# Patient Record
Sex: Female | Born: 1935 | Race: White | Hispanic: No | Marital: Single | Smoking: Never smoker
Health system: Southern US, Community
[De-identification: ages and names within clinical notes are randomized; demographics above are authoritative.]

## PROBLEM LIST (undated history)

## (undated) DIAGNOSIS — E7800 Pure hypercholesterolemia, unspecified: Secondary | ICD-10-CM

## (undated) DIAGNOSIS — E079 Disorder of thyroid, unspecified: Secondary | ICD-10-CM

## (undated) DIAGNOSIS — F0390 Unspecified dementia without behavioral disturbance: Secondary | ICD-10-CM

## (undated) HISTORY — PX: TONSILLECTOMY: SUR1361

## (undated) HISTORY — PX: GASTRIC BANDING PORT REVISION: SHX5246

---

## 2015-04-21 ENCOUNTER — Emergency Department (HOSPITAL_COMMUNITY): Payer: Medicare (Managed Care)

## 2015-04-21 ENCOUNTER — Encounter (HOSPITAL_COMMUNITY): Payer: Self-pay | Admitting: *Deleted

## 2015-04-21 ENCOUNTER — Observation Stay (HOSPITAL_COMMUNITY)
Admission: EM | Admit: 2015-04-21 | Discharge: 2015-04-23 | Disposition: A | Discharge status: Home / Self Care | Payer: Medicare (Managed Care) | Attending: Family Medicine | Admitting: Family Medicine

## 2015-04-21 DIAGNOSIS — R627 Adult failure to thrive: Secondary | ICD-10-CM

## 2015-04-21 DIAGNOSIS — E860 Dehydration: Secondary | ICD-10-CM | POA: Diagnosis present

## 2015-04-21 DIAGNOSIS — Z7982 Long term (current) use of aspirin: Secondary | ICD-10-CM | POA: Insufficient documentation

## 2015-04-21 DIAGNOSIS — R110 Nausea: Secondary | ICD-10-CM | POA: Diagnosis not present

## 2015-04-21 DIAGNOSIS — N390 Urinary tract infection, site not specified: Secondary | ICD-10-CM | POA: Insufficient documentation

## 2015-04-21 DIAGNOSIS — Z9884 Bariatric surgery status: Secondary | ICD-10-CM | POA: Diagnosis not present

## 2015-04-21 DIAGNOSIS — E876 Hypokalemia: Secondary | ICD-10-CM | POA: Insufficient documentation

## 2015-04-21 DIAGNOSIS — Z23 Encounter for immunization: Secondary | ICD-10-CM | POA: Diagnosis not present

## 2015-04-21 DIAGNOSIS — Z79899 Other long term (current) drug therapy: Secondary | ICD-10-CM | POA: Diagnosis not present

## 2015-04-21 DIAGNOSIS — R059 Cough, unspecified: Secondary | ICD-10-CM

## 2015-04-21 DIAGNOSIS — I951 Orthostatic hypotension: Secondary | ICD-10-CM | POA: Diagnosis not present

## 2015-04-21 DIAGNOSIS — R05 Cough: Secondary | ICD-10-CM

## 2015-04-21 DIAGNOSIS — F0390 Unspecified dementia without behavioral disturbance: Secondary | ICD-10-CM | POA: Insufficient documentation

## 2015-04-21 DIAGNOSIS — E7800 Pure hypercholesterolemia, unspecified: Secondary | ICD-10-CM | POA: Diagnosis not present

## 2015-04-21 DIAGNOSIS — R822 Biliuria: Secondary | ICD-10-CM | POA: Diagnosis not present

## 2015-04-21 HISTORY — DX: Unspecified dementia, unspecified severity, without behavioral disturbance, psychotic disturbance, mood disturbance, and anxiety: F03.90

## 2015-04-21 HISTORY — DX: Disorder of thyroid, unspecified: E07.9

## 2015-04-21 HISTORY — DX: Pure hypercholesterolemia, unspecified: E78.00

## 2015-04-21 LAB — URINALYSIS, ROUTINE W REFLEX MICROSCOPIC
GLUCOSE, UA: NEGATIVE mg/dL
KETONES UR: 40 mg/dL — AB
NITRITE: NEGATIVE
PH: 6 (ref 5.0–8.0)
PROTEIN: 100 mg/dL — AB
SPECIFIC GRAVITY, URINE: 1.025 (ref 1.005–1.030)
Urobilinogen, UA: 0.2 mg/dL (ref 0.0–1.0)

## 2015-04-21 LAB — COMPREHENSIVE METABOLIC PANEL
ALBUMIN: 3.7 g/dL (ref 3.5–5.0)
ALT: 13 U/L — ABNORMAL LOW (ref 14–54)
ANION GAP: 15 (ref 5–15)
AST: 23 U/L (ref 15–41)
Alkaline Phosphatase: 67 U/L (ref 38–126)
BILIRUBIN TOTAL: 1.9 mg/dL — AB (ref 0.3–1.2)
BUN: 33 mg/dL — AB (ref 6–20)
CHLORIDE: 108 mmol/L (ref 101–111)
CO2: 20 mmol/L — AB (ref 22–32)
Calcium: 8.8 mg/dL — ABNORMAL LOW (ref 8.9–10.3)
Creatinine, Ser: 1.12 mg/dL — ABNORMAL HIGH (ref 0.44–1.00)
GFR calc Af Amer: 50 mL/min — ABNORMAL LOW (ref >60)
GFR calc non Af Amer: 43 mL/min — ABNORMAL LOW (ref >60)
GLUCOSE: 116 mg/dL — AB (ref 65–99)
POTASSIUM: 2.8 mmol/L — AB (ref 3.5–5.1)
SODIUM: 143 mmol/L (ref 135–145)
TOTAL PROTEIN: 6.6 g/dL (ref 6.5–8.1)

## 2015-04-21 LAB — CBC WITH DIFFERENTIAL/PLATELET
BASOS ABS: 0 10*3/uL (ref 0.0–0.1)
BASOS PCT: 0 %
EOS ABS: 0 10*3/uL (ref 0.0–0.7)
Eosinophils Relative: 0 %
HEMATOCRIT: 38.1 % (ref 36.0–46.0)
HEMOGLOBIN: 13.1 g/dL (ref 12.0–15.0)
Lymphocytes Relative: 6 %
Lymphs Abs: 0.8 10*3/uL (ref 0.7–4.0)
MCH: 32.3 pg (ref 26.0–34.0)
MCHC: 34.4 g/dL (ref 30.0–36.0)
MCV: 94.1 fL (ref 78.0–100.0)
MONO ABS: 0.8 10*3/uL (ref 0.1–1.0)
Monocytes Relative: 6 %
NEUTROS ABS: 11.7 10*3/uL — AB (ref 1.7–7.7)
NEUTROS PCT: 88 %
Platelets: 234 10*3/uL (ref 150–400)
RBC: 4.05 MIL/uL (ref 3.87–5.11)
RDW: 14.8 % (ref 11.5–15.5)
WBC: 13.3 10*3/uL — ABNORMAL HIGH (ref 4.0–10.5)

## 2015-04-21 LAB — TROPONIN I: TROPONIN I: 0.04 ng/mL — AB (ref <0.031)

## 2015-04-21 LAB — LIPASE, BLOOD: Lipase: 36 U/L (ref 22–51)

## 2015-04-21 LAB — URINE MICROSCOPIC-ADD ON

## 2015-04-21 MED ORDER — ONDANSETRON 4 MG PO TBDP
4.0000 mg | ORAL_TABLET | Freq: Three times a day (TID) | ORAL | Status: DC | PRN
  Administered 2015-04-21 – 2015-04-23 (×2): 4 mg via ORAL
  Filled 2015-04-21 (×2): qty 1

## 2015-04-21 MED ORDER — SODIUM CHLORIDE 0.9 % IV SOLN
INTRAVENOUS | Status: AC
  Administered 2015-04-21: 75 mL/h via INTRAVENOUS

## 2015-04-21 MED ORDER — SODIUM CHLORIDE 0.9 % IV SOLN
INTRAVENOUS | Status: DC
  Administered 2015-04-21 – 2015-04-23 (×4): via INTRAVENOUS

## 2015-04-21 MED ORDER — SODIUM CHLORIDE 0.9 % IV SOLN: Freq: Once | INTRAVENOUS | Status: DC

## 2015-04-21 MED ORDER — POTASSIUM CHLORIDE CRYS ER 20 MEQ PO TBCR
40.0000 meq | EXTENDED_RELEASE_TABLET | ORAL | Status: AC
  Administered 2015-04-21: 40 meq via ORAL
  Filled 2015-04-21: qty 2

## 2015-04-21 MED ORDER — SODIUM CHLORIDE 0.9 % IV BOLUS (SEPSIS)
1000.0000 mL | Freq: Once | INTRAVENOUS | Status: AC
  Administered 2015-04-21: 1000 mL via INTRAVENOUS

## 2015-04-21 MED ORDER — CEPHALEXIN 250 MG PO CAPS
250.0000 mg | ORAL_CAPSULE | Freq: Three times a day (TID) | ORAL | Status: DC
  Administered 2015-04-21 – 2015-04-23 (×6): 250 mg via ORAL
  Filled 2015-04-21 (×6): qty 1

## 2015-04-21 MED ORDER — POTASSIUM CHLORIDE CRYS ER 20 MEQ PO TBCR
40.0000 meq | EXTENDED_RELEASE_TABLET | Freq: Once | ORAL | Status: AC
  Administered 2015-04-21: 40 meq via ORAL
  Filled 2015-04-21: qty 2

## 2015-04-21 MED ORDER — ACETAMINOPHEN 325 MG PO TABS: 650.0000 mg | ORAL_TABLET | Freq: Four times a day (QID) | ORAL | Status: DC | PRN

## 2015-04-21 MED ORDER — LEVOTHYROXINE SODIUM 75 MCG PO TABS
75.0000 ug | ORAL_TABLET | Freq: Every day | ORAL | Status: DC
  Administered 2015-04-22 – 2015-04-23 (×2): 75 ug via ORAL
  Filled 2015-04-21 (×2): qty 1

## 2015-04-21 MED ORDER — ONDANSETRON HCL 4 MG/2ML IJ SOLN
4.0000 mg | Freq: Once | INTRAMUSCULAR | Status: AC
  Administered 2015-04-21: 4 mg via INTRAVENOUS
  Filled 2015-04-21: qty 2

## 2015-04-21 MED ORDER — ACETAMINOPHEN 650 MG RE SUPP: 650.0000 mg | Freq: Four times a day (QID) | RECTAL | Status: DC | PRN

## 2015-04-21 NOTE — ED Provider Notes (Signed)
CSN: [id number]     Arrival date & time 04/21/15  1058 History  By signing my name below, I, Ronney Lion, attest that this documentation has been prepared under the direction and in the presence of Glynn Octave, MD. Electronically Signed: Ronney Lion, ED Scribe. 04/21/2015. 9:54 PM   Chief Complaint  Patient presents with  . Cough   The history is provided by the patient. No language interpreter was used.    HPI Comments: Melissa Green is a 81 y.o. female who presents to the Emergency Department complaining of a constant, productive cough for 2 days. Patient also notes associated nausea with dry heaves, loss of appetite, and feeling generally unwell, lying around more than usual and with decreased PO intake. This is a new problem. Patient denies a history of smoking. She also denies any known history of asthma or COPD. She denies fever, vomiting, dysuria, sore throat, chest pain, SOB, back pain, falls, or any other pain. Patient recently moved in with family members from [place].  Past Medical History  Diagnosis Date  . Thyroid disease   . Hypercholesterolemia   . Dementia    Past Surgical History  Procedure Laterality Date  . Tonsillectomy    . Gastric banding port revision     History reviewed. No pertinent family history. Social History  Substance Use Topics  . Smoking status: Never Smoker   . Smokeless tobacco: None  . Alcohol Use: No   OB History    No data available     Review of Systems A complete 10 system review of systems was obtained and all systems are negative except as noted in the HPI and PMH.    Allergies  Review of patient's allergies indicates no known allergies.  Home Medications   Prior to Admission medications   Medication Sig Start Date End Date Taking? Authorizing Provider  alendronate (FOSAMAX) 70 MG tablet Take 70 mg by mouth once a week. Take with a full glass of water on an empty stomach.   Yes Historical Provider, MD  aspirin EC 81 MG  tablet Take 81 mg by mouth daily.   Yes Historical Provider, MD  atorvastatin (LIPITOR) 40 MG tablet Take 40 mg by mouth daily.   Yes Historical Provider, MD  levothyroxine (SYNTHROID, LEVOTHROID) 75 MCG tablet Take 75 mcg by mouth daily before breakfast.   Yes Historical Provider, MD   BP 130/104 mmHg  Pulse 71  Temp(Src) 98.3 F (36.8 C) (Oral)  Resp 19  Ht 5' 1.5" (1.562 m)  Wt 126 lb (57.153 kg)  BMI 23.42 kg/m2  SpO2 98% Physical Exam  Constitutional: She is oriented to person, place, and time. She appears well-developed and well-nourished. No distress.  HENT:  Head: Normocephalic and atraumatic.  Mouth/Throat: Oropharynx is clear and moist. No oropharyngeal exudate.  Dry mucous membranes.  Eyes: Conjunctivae and EOM are normal. Pupils are equal, round, and reactive to light.  Neck: Normal range of motion. Neck supple.  No meningismus.  Cardiovascular: Normal rate, regular rhythm, normal heart sounds and intact distal pulses.   No murmur heard. Pulmonary/Chest: Effort normal and breath sounds normal. No respiratory distress. She has no wheezes.  Lungs are clear to auscultation.   Abdominal: Soft. There is tenderness. There is no rebound and no guarding.  Diffuse abdominal tenderness.  Musculoskeletal: Normal range of motion. She exhibits no edema or tenderness.  Neurological: She is alert and oriented to person, place, and time. No cranial nerve deficit. She exhibits normal muscle tone.  Coordination normal.  No ataxia on finger to nose bilaterally. No pronator drift. 5/5 strength throughout. CN 2-12 intact. Equal grip strength. Sensation intact.   Skin: Skin is warm.  Psychiatric: She has a normal mood and affect. Her behavior is normal.  Nursing note and vitals reviewed.   ED Course  Procedures (including critical care time)  DIAGNOSTIC STUDIES: Oxygen Saturation is 100% on RA, normal by my interpretation.    COORDINATION OF CARE: 12:12 PM - Discussed treatment plan  with pt at bedside which includes bloodwork, UA, and IV fluids. Pt verbalized understanding and agreed to plan.   Labs Review Labs Reviewed  CBC WITH DIFFERENTIAL/PLATELET - Abnormal; Notable for the following:    WBC 13.3 (*)    Neutro Abs 11.7 (*)    All other components within normal limits  COMPREHENSIVE METABOLIC PANEL - Abnormal; Notable for the following:    Potassium 2.8 (*)    CO2 20 (*)    Glucose, Bld 116 (*)    BUN 33 (*)    Creatinine, Ser 1.12 (*)    Calcium 8.8 (*)    ALT 13 (*)    Total Bilirubin 1.9 (*)    GFR calc non Af Amer 43 (*)    GFR calc Af Amer 50 (*)    All other components within normal limits  URINALYSIS, ROUTINE W REFLEX MICROSCOPIC (NOT AT [facility]) - Abnormal; Notable for the following:    APPearance HAZY (*)    Hgb urine dipstick LARGE (*)    Bilirubin Urine MODERATE (*)    Ketones, ur 40 (*)    Protein, ur 100 (*)    Leukocytes, UA TRACE (*)    All other components within normal limits  TROPONIN I - Abnormal; Notable for the following:    Troponin I 0.04 (*)    All other components within normal limits  URINE MICROSCOPIC-ADD ON - Abnormal; Notable for the following:    Bacteria, UA MANY (*)    All other components within normal limits  URINE CULTURE  LIPASE, BLOOD  COMPREHENSIVE METABOLIC PANEL  CBC    Imaging Review Dg Chest 2 View  04/21/2015  CLINICAL DATA:  Cough and congestion for 2 days EXAM: CHEST  2 VIEW COMPARISON:  None. FINDINGS: Normal heart size. Aortic tortuosity, mild for age. There is no edema, consolidation, effusion, or pneumothorax. IMPRESSION: No active cardiopulmonary disease. Electronically Signed   By: Marnee SpringJonathon  Watts M.D.   On: 04/21/2015 11:57   I have personally reviewed and evaluated these images and lab results as part of my medical decision-making.   EKG Interpretation   Date/Time:  Thursday April 21 2015 12:29:27 EDT Ventricular Rate:  74 PR Interval:  138 QRS Duration: 97 QT Interval:  397 QTC  Calculation: 440 R Axis:   -27 Text Interpretation:  Sinus rhythm Borderline left axis deviation Abnormal  R-wave progression, early transition Artifact in lead(s) I II III aVR aVL  aVF V1 V2 V5 Artifact No previous ECGs available Confirmed by Manus GunningANCOUR  MD,  Laquilla Dault [phone]([id number]) on 04/21/2015 12:33:54 PM      MDM   Final diagnoses:  Dehydration   patient recently moved from [place] has been feeling unwell for the past 3 days, not eating, not drinking, cough, nausea without vomiting. No appetite and wanting to lay around in bed. No fevers. No chest pain.  Dry appearing. Vital stable. Afebrile. Lungs are clear with a negative chest x-ray.  Labs show leukocytosis, hypokalemia, minimal troponin elevation. Orthostatics positive.  IV fluids given. Urinalysis consistent with infection.  Failure to thrive, dehydration, hypokalemia, confusion likely worsen in setting of UTI.  Admission for IV fluids and antibiotics discussed with Dr. Irene Limbo.   I personally performed the services described in this documentation, which was scribed in my presence. The recorded information has been reviewed and is accurate.   Glynn Octave, MD 04/21/15 2156

## 2015-04-21 NOTE — H&P (Signed)
History and Physical  Melissa FrancoSharon Green VHQ:[id number] DOB: 12-02-1924 DOA: 04/21/2015  Referring physician: Dr. Manus Gunningancour in ED PCP: PROVIDER NOT IN SYSTEM None, just moved from [place]  Chief Complaint: feels poorly  HPI:  89yow PMH unclear, just moved from [place], was brought to the emergency department for failure to thrive and poor oral intake. She was found to have orthostatic hypotension, dehydration, hypokalemia and a possible UTI.  The patient is confused, believes [place] in [place] and is unable to provide much history but does report some nausea and poor intake. History is predominantly obtained from niece Margaretha GlassingLoretta who reports the patient's health has declined over the last year in [place], she was subsisting on Pepsi and potato chips and cheese, so they went and picked her up 3 weeks ago and brought her to live with them. She is normally very ambulatory and can take care of herself and only gets confused when she is tired but over the last several days she has stopped eating and she was noted to be dehydrated complaining of some nausea and vague abdominal pain which they thought was secondary to hunger.  Complete medical history is not known but she does have a thyroid problem for which she takes medication.  Niece hopes to take the patient home very soon and wishes for her to be comfortable  In the emergency department afebrile, VSS but orthostatic, no hypoxia Pertinent labs: potassium 2.8, BUN 33, creatinine 1.12, t bili 1.9, normal AP and lipase. Urinalysis positive.  EKG: Independently reviewed. SR, poor quality. No acute changes. Imaging: independently reviewed. CXR NAD  Review of Systems:  Negative for fever, visual changes, sore throat, rash, new muscle aches, chest pain, SOB, dysuria, bleeding, v/abdominal pain.  Positive for nausea  Past Medical History  Diagnosis Date  . Thyroid disease   . Hypercholesterolemia   . Dementia     Past Surgical History  Procedure  Laterality Date  . Tonsillectomy    . Gastric banding port revision      Social History:  reports that she has never smoked. She does not have any smokeless tobacco history on file. She reports that she does not drink alcohol. Her drug history is not on file.    No Known Allergies  History reviewed. No pertinent family history. Denies particular medical problems in family  Prior to Admission medications   Medication Sig Start Date End Date Taking? Authorizing Provider  alendronate (FOSAMAX) 70 MG tablet Take 70 mg by mouth once a week. Take with a full glass of water on an empty stomach.   Yes Historical Provider, MD  aspirin EC 81 MG tablet Take 81 mg by mouth daily.   Yes Historical Provider, MD  atorvastatin (LIPITOR) 40 MG tablet Take 40 mg by mouth daily.   Yes Historical Provider, MD  levothyroxine (SYNTHROID, LEVOTHROID) 75 MCG tablet Take 75 mcg by mouth daily before breakfast.   Yes Historical Provider, MD   Physical Exam: Filed Vitals:   04/21/15 1500 04/21/15 1530 04/21/15 1533 04/21/15 1553  BP: 164/61 149/77  170/78  Pulse: 65 69  78  Temp:   97.8 F (36.6 C) 98.3 F (36.8 C)  TempSrc:   Oral   Resp: 21 19  20   Height:    5' 1.5" (1.562 m)  Weight:    57.153 kg (126 lb)  SpO2: 97% 98%  98%     General:  Appears calm and comfortable, wears glasses Eyes: PERRL, normal lids, irises   ENT: grossly  normal hearing, lips   Neck: no LAD, masses or thyromegaly Cardiovascular: RRR, no m/r/g. No LE edema. Respiratory: CTA bilaterally, no w/r/r. Normal respiratory effort. Abdomen: soft, ntnd Skin: no rash or induration noted  Musculoskeletal: grossly normal tone BUE/BLE Psychiatric: grossly normal mood and affect, speech fluent and appropriate. Oriented to self, "[place]", "2021", Obama Neurologic: grossly non-focal.  Wt Readings from Last 3 Encounters:  04/21/15 57.153 kg (126 lb)    Labs on Admission:  Basic Metabolic Panel:  Recent Labs Lab 04/21/15 1222    NA 143  K 2.8*  CL 108  CO2 20*  GLUCOSE 116*  BUN 33*  CREATININE 1.12*  CALCIUM 8.8*    Liver Function Tests:  Recent Labs Lab 04/21/15 1222  AST 23  ALT 13*  ALKPHOS 67  BILITOT 1.9*  PROT 6.6  ALBUMIN 3.7    Recent Labs Lab 04/21/15 1234  LIPASE 36    CBC:  Recent Labs Lab 04/21/15 1222  WBC 13.3*  NEUTROABS 11.7*  HGB 13.1  HCT 38.1  MCV 94.1  PLT 234   Cardiac Enzymes:  Recent Labs Lab 04/21/15 1222  TROPONINI 0.04*      Radiological Exams on Admission: Dg Chest 2 View  04/21/2015  CLINICAL DATA:  Cough and congestion for 2 days EXAM: CHEST  2 VIEW COMPARISON:  None. FINDINGS: Normal heart size. Aortic tortuosity, mild for age. There is no edema, consolidation, effusion, or pneumothorax. IMPRESSION: No active cardiopulmonary disease. Electronically Signed   By: Marnee Spring M.D.   On: 04/21/2015 11:57    Principal Problem:   Dehydration Active Problems:   Nausea without vomiting   FTT (failure to thrive) in adult   Orthostatic hypotension   Assessment/Plan 1. Dehydration, nausea, FTT, etiology unclear but appears to be a subacute issue, possibly worse with UTI. 2. Vague abdominal pain (resolved). Exam is benign. Follow clinically. 3. Orthostatic hypotension secondary to dehydration. 4. Hypokalemia. Secondary to poor oral intake. 5. Elevated bilirubin, likely dehydration. 6. Elevated troponin, minimal, asymptomatic, EKG non acute. Doubt significant. No history to suggest ACS. 7. Possible UTI. 8. Suspected dementia   Appears stable.  Plan obs, IVF, check orthostatics in AM  Replete potassium  Check urine culture. Keflex.   PT consult.   Discussed with Ivar Drape niece  Code Status: full code DVT prophylaxis: SCDs Family Communication: niece Disposition Plan/Anticipated LOS: obs, 1-2 days  Time spent: 60 minutes  Brendia Sacks, MD  [facility] Pager [phone] 04/21/2015, 5:40 PM

## 2015-04-21 NOTE — ED Notes (Signed)
Pt recently moved in with family members from [place]. States not eating well since before moving. Productive color, unsure of color. Cough x 2 days. Generally, not feeling well, per pt. Also states nausea with heaves. Denies diarrhea

## 2015-04-22 DIAGNOSIS — R627 Adult failure to thrive: Secondary | ICD-10-CM | POA: Diagnosis not present

## 2015-04-22 DIAGNOSIS — E860 Dehydration: Secondary | ICD-10-CM | POA: Diagnosis not present

## 2015-04-22 LAB — CBC
HEMATOCRIT: 39 % (ref 36.0–46.0)
HEMOGLOBIN: 13.1 g/dL (ref 12.0–15.0)
MCH: 32.2 pg (ref 26.0–34.0)
MCHC: 33.6 g/dL (ref 30.0–36.0)
MCV: 95.8 fL (ref 78.0–100.0)
PLATELETS: 216 10*3/uL (ref 150–400)
RBC: 4.07 MIL/uL (ref 3.87–5.11)
RDW: 15.2 % (ref 11.5–15.5)
WBC: 12.5 10*3/uL — ABNORMAL HIGH (ref 4.0–10.5)

## 2015-04-22 LAB — COMPREHENSIVE METABOLIC PANEL
ALBUMIN: 3.9 g/dL (ref 3.5–5.0)
ALK PHOS: 65 U/L (ref 38–126)
ALT: 13 U/L — ABNORMAL LOW (ref 14–54)
ANION GAP: 13 (ref 5–15)
AST: 25 U/L (ref 15–41)
BUN: 27 mg/dL — ABNORMAL HIGH (ref 6–20)
CALCIUM: 8.4 mg/dL — AB (ref 8.9–10.3)
CHLORIDE: 108 mmol/L (ref 101–111)
CO2: 23 mmol/L (ref 22–32)
Creatinine, Ser: 1 mg/dL (ref 0.44–1.00)
GFR calc non Af Amer: 49 mL/min — ABNORMAL LOW (ref >60)
GFR, EST AFRICAN AMERICAN: 57 mL/min — AB (ref >60)
GLUCOSE: 102 mg/dL — AB (ref 65–99)
POTASSIUM: 2.4 mmol/L — AB (ref 3.5–5.1)
SODIUM: 144 mmol/L (ref 135–145)
Total Bilirubin: 1.9 mg/dL — ABNORMAL HIGH (ref 0.3–1.2)
Total Protein: 6.6 g/dL (ref 6.5–8.1)

## 2015-04-22 LAB — MAGNESIUM: MAGNESIUM: 1.6 mg/dL — AB (ref 1.7–2.4)

## 2015-04-22 LAB — PHOSPHORUS: PHOSPHORUS: 1.5 mg/dL — AB (ref 2.5–4.6)

## 2015-04-22 MED ORDER — INFLUENZA VAC SPLIT QUAD 0.5 ML IM SUSY
0.5000 mL | PREFILLED_SYRINGE | INTRAMUSCULAR | Status: AC
  Administered 2015-04-23: 0.5 mL via INTRAMUSCULAR

## 2015-04-22 MED ORDER — POTASSIUM CHLORIDE CRYS ER 20 MEQ PO TBCR
40.0000 meq | EXTENDED_RELEASE_TABLET | ORAL | Status: AC
  Administered 2015-04-22 (×4): 40 meq via ORAL
  Filled 2015-04-22 (×4): qty 2

## 2015-04-22 MED ORDER — POTASSIUM PHOSPHATES 15 MMOLE/5ML IV SOLN
30.0000 mmol | Freq: Once | INTRAVENOUS | Status: AC
  Administered 2015-04-22: 30 mmol via INTRAVENOUS
  Filled 2015-04-22: qty 10

## 2015-04-22 MED ORDER — MAGNESIUM SULFATE 2 GM/50ML IV SOLN
2.0000 g | Freq: Once | INTRAVENOUS | Status: AC
  Administered 2015-04-22: 2 g via INTRAVENOUS
  Filled 2015-04-22: qty 50

## 2015-04-22 MED ORDER — PNEUMOCOCCAL VAC POLYVALENT 25 MCG/0.5ML IJ INJ
0.5000 mL | INJECTION | INTRAMUSCULAR | Status: AC
  Administered 2015-04-23: 0.5 mL via INTRAMUSCULAR
  Filled 2015-04-22: qty 0.5

## 2015-04-22 NOTE — Care Management Note (Signed)
Case Management Note  Patient Details  Name: Tilda FrancoSharon Resh MRN: [id number] Date of Birth: February 08, 1925  Expected Discharge Date:    04/23/2015              Expected Discharge Plan:  Home w Home Health Services  In-House Referral:     Discharge planning Services  CM Consult  Post Acute Care Choice:  [facility]  DME Arranged:    DME Agency:     HH Arranged:  PT HH Agency:  [facility]  Status of Service:  Completed, signed off  Medicare Important Message Given:    Date Medicare IM Given:    Medicare IM give by:    Date Additional Medicare IM Given:    Additional Medicare Important Message give by:     If discussed at Long Length of Stay Meetings, dates discussed:    Additional Comments: PT has recommended HH pt. Pt's niece requests [facility] as she has used them in the past. Niece also request list of PCP accepting new pt's as they are interested in changing PCP's. Al[facility]ian [facility] made [facility] of [facility] and will obtain pt info from chart. Niece aware HH has 48 hours to make first visit. Per niece pt has no DME needs. No further CM needs noted.  Malcolm Metrohildress, Jaelin Devincentis Demske, RN 04/22/2015, 4:24 PM

## 2015-04-22 NOTE — Progress Notes (Signed)
PROGRESS NOTE  Melissa FrancoSharon Green ZOX:[id number] DOB: 22-Mar-1925 DOA: 04/21/2015 PCP: PROVIDER NOT IN SYSTEM  Summary: 81 yo female with unclear past medical history who recently moved from [place] was brought to the ED for poor oral intake. She was found to have orthostatic hypotension, dehydration, hypokalemia and a possible UTI. While in the ED, labs revealed hypokalemia with potassium at 2.8, elevated t. bili at 1.9, elevated BUN/creatinine at 33/1.12, leukocytosis with WBC at 13.3 and positive troponin at 0.04. Patient was admitted for further management.  Assessment/Plan: 1. Dehydration, nausea, FTT, likely worse with UTI.  2. Orthostatic hypotension, persists, secondary to dehydration. 3. Hypokalemia, hypomagnesemia, hypophosphatemia. Secondary to poor oral intake.   4. UTI. Empiric abx. Follow-up culture. 5. Vague abdominal pain (resolved). Continue to follow clinically. 6. Elevated bilirubin, likely dehydration. 7. Elevated troponin, minimal, asymptomatic, EKG non acute. Doubt significant. No history to suggest ACS. 8. Suspected dementia.   Overall improved. Continue IVF, replete electrolytes.  Check BMP, magnesium, phosphorus in AM.   If improved, likely discharge.   Code Status: full code DVT prophylaxis: SCDs Family discussion: Discussed plan in detail. No further concerns at this time. Disposition Plan/Anticipated LOS: obs, 1-2 days  Brendia Sacksaniel Constantine Ruddick, MD  [facility]  Pager [phone] If 7PM-7AM, please contact night-coverage at [email or web address], password [email or web address] 04/22/2015, 7:26 AM  LOS: 1 day   Consultants:  PT  Procedures:    Antibiotics:    HPI/Subjective: Doing okay today. No nausea, vomiting or pain. Confused per PT.  Objective: Filed Vitals:   04/21/15 1533 04/21/15 1553 04/21/15 2016 04/22/15 0548  BP:  170/78 130/104 159/83  Pulse:  78 71 79  Temp: 97.8 F (36.6 C) 98.3 F (36.8 C) 98.3 F (36.8 C) 97.9 F (36.6 C)  TempSrc: Oral  Oral Oral    Resp:  20 19 20   Height:  5' 1.5" (1.562 m)    Weight:  57.153 kg (126 lb)    SpO2:  98% 98% 99%    Intake/Output Summary (Last 24 hours) at 04/22/15 0726 Last data filed at 04/21/15 1800  Gross per 24 hour  Intake     60 ml  Output      0 ml  Net     60 ml     Filed Weights   04/21/15 1102 04/21/15 1553  Weight: 57.153 kg (126 lb) 57.153 kg (126 lb)    Exam: Afebrile, VSS, not hypoxic General:  Appears comfortable, calm. Eyes: PERRL, normal lids, irises. Wears glasses. ENT: grossly normal hearing, lips, tongue Neck: no LAD, masses, thyromegaly Cardiovascular: Regular rate and rhythm, no murmur, rub or gallop. No lower extremity edema. Respiratory: Clear to auscultation bilaterally, no wheezes, rales or rhonchi. Normal respiratory effort. Abdomen: soft, ntnd Musculoskeletal: grossly normal tone bilateral upper and lower extremities Psychiatric: grossly normal mood and affect, speech fluent and appropriate Neurologic: grossly non-focal. Oriented to self, place, year and current president.  New data reviewed:  Potassium 2.4, BUN improved  27, Creatinine  1.00,. Mg 1.6, Phosphorus 1.5  T. Bili 1.9  WBC 12.5  Orthostatics positive.  Pertinent data since admission:  Troponin 0.04, positive  WBC 13.3  Potassium 2.8, creatinine 1.12, BUN 33  CXD -No active cardiopulmonary disease.  UA positive   Pending data:  CMP  UC  Scheduled Meds: . cephALEXin  250 mg Oral 3 times per day  . levothyroxine  75 mcg Oral QAC breakfast   Continuous Infusions: . sodium chloride 150 mL/hr at 04/21/15 2352    Principal Problem:  Dehydration Active Problems:   Nausea without vomiting   FTT (failure to thrive) in adult   Orthostatic hypotension   Hypophosphatemia   Hypomagnesemia   Time spent 20 minutes  By signing my name below, I, Edman Circle attest that this documentation has been prepared under the direction and in the presence of Brendia Sacks, MD    Electronically signed: Edman Circle  04/22/2015 11:57 AM  I personally performed the services described in this documentation. All medical record entries made by the scribe were at my direction. I have reviewed the chart and agree that the record reflects my personal performance and is accurate and complete. Brendia Sacks, MD

## 2015-04-22 NOTE — Plan of Care (Signed)
Text dr. To inform of critical potassium of 2.4.

## 2015-04-22 NOTE — Evaluation (Signed)
Physical Therapy Evaluation Patient Details Name: Melissa Green MRN: [id number] DOB: [birth year] Today's Date: 04/22/2015   History of Present Illness  Pt is an 81 year old female recently moved here from [place] who was admitted with dehydration, poor oral intake and orthostatic hypotension.  Pt has dementia and is unable to give history but per chart review her niece stated that prior to her recent decline she was independent with mobility at home.  Clinical Impression  Pt was seen for evaluation, all snuggled in the bed.  She was somewhat uninterested in working with me but did cooperate.  She was found to have strength to WNL but appears to be mildly deconditioned.  She was able to stand and walk with a walker with good stability and ambulated 150'.  She did report fatigue.  She had no sx of orthostasis although BPs recently taken indicate a drop in BP with standing.  Due to her dementia and recent move here I would recommend HHPT in order to insure safety at home.    Follow Up Recommendations Home health PT    Equipment Recommendations  None recommended by PT    Recommendations for Other Services   none    Precautions / Restrictions Precautions Precautions: Fall Restrictions Weight Bearing Restrictions: No      Mobility  Bed Mobility Overal bed mobility: Modified Independent                Transfers Overall transfer level: Modified independent Equipment used: Rolling walker (2 wheeled)                Ambulation/Gait Ambulation/Gait assistance: Supervision Ambulation Distance (Feet): 150 Feet Assistive device: Rolling walker (2 wheeled) Gait Pattern/deviations: Narrow base of support;Shuffle;Decreased dorsiflexion - right;Decreased dorsiflexion - left;Step-through pattern   Gait velocity interpretation: >2.62 ft/sec, indicative of independent community ambulator General Gait Details: has some difficulty controlling the walker which may be a part of her  dementia  Stairs            Wheelchair Mobility    Modified Rankin (Stroke Patients Only)       Balance Overall balance assessment: No apparent balance deficits (not formally assessed)                                           Pertinent Vitals/Pain Pain Assessment: No/denies pain    Home Living Family/patient expects to be discharged to:: Private residence Living Arrangements: Other relatives Available Help at Discharge: Family           Home Equipment: Gilford Rile - 2 wheels;Cane - single point Additional Comments: pt is unable to provide the information above...no family is present    Prior Function Level of Independence: Independent with assistive device(s)         Comments: pt states that she usually uses a cane for gait but sometimes uses a walker     Hand Dominance        Extremity/Trunk Assessment   Upper Extremity Assessment: Overall WFL for tasks assessed           Lower Extremity Assessment: Overall WFL for tasks assessed      Cervical / Trunk Assessment: Kyphotic  Communication   Communication: No difficulties  Cognition Arousal/Alertness: Lethargic Behavior During Therapy: Flat affect Overall Cognitive Status: History of cognitive impairments - at baseline (oriented only to self)  General Comments      Exercises        Assessment/Plan    PT Assessment Patent does not need any further PT services;All further PT needs can be met in the next venue of care  PT Diagnosis  (deconditioned)   PT Problem List Decreased activity tolerance;Decreased mobility;Decreased cognition;Decreased knowledge of use of DME  PT Treatment Interventions     PT Goals (Current goals can be found in the Care Plan section) Acute Rehab PT Goals PT Goal Formulation: All assessment and education complete, DC therapy    Frequency     Barriers to discharge        Co-evaluation               End of  Session Equipment Utilized During Treatment: Gait belt Activity Tolerance: Patient tolerated treatment well Patient left: in bed;with call bell/phone within reach;with bed alarm set Nurse Communication: Mobility status    Functional Assessment Tool Used: clinical observation Functional Limitation: Mobility: Walking and moving around Mobility: Walking and Moving Around Current Status (Q0699): At least 1 percent but less than 20 percent impaired, limited or restricted Mobility: Walking and Moving Around Goal Status [phone]): At least 1 percent but less than 20 percent impaired, limited or restricted Mobility: Walking and Moving Around Discharge Status [phone]): At least 1 percent but less than 20 percent impaired, limited or restricted    Time: 1142-1202 PT Time Calculation (min) (ACUTE ONLY): 20 min   Charges:   PT Evaluation $Initial PT Evaluation Tier I: 1 Procedure     PT G Codes:   PT G-Codes **NOT FOR INPATIENT CLASS** Functional Assessment Tool Used: clinical observation Functional Limitation: Mobility: Walking and moving around Mobility: Walking and Moving Around Current Status ([id number]): At least 1 percent but less than 20 percent impaired, limited or restricted Mobility: Walking and Moving Around Goal Status [phone]): At least 1 percent but less than 20 percent impaired, limited or restricted Mobility: Walking and Moving Around Discharge Status [phone]): At least 1 percent but less than 20 percent impaired, limited or restricted    Sable Feil  PT 04/22/2015, 12:13 PM [phone]

## 2015-04-23 ENCOUNTER — Observation Stay (HOSPITAL_COMMUNITY): Payer: Medicare (Managed Care)

## 2015-04-23 DIAGNOSIS — E876 Hypokalemia: Secondary | ICD-10-CM

## 2015-04-23 DIAGNOSIS — R627 Adult failure to thrive: Secondary | ICD-10-CM | POA: Diagnosis not present

## 2015-04-23 DIAGNOSIS — E860 Dehydration: Secondary | ICD-10-CM | POA: Diagnosis not present

## 2015-04-23 LAB — BASIC METABOLIC PANEL
Anion gap: 8 (ref 5–15)
BUN: 15 mg/dL (ref 6–20)
CHLORIDE: 107 mmol/L (ref 101–111)
CO2: 23 mmol/L (ref 22–32)
Calcium: 7.5 mg/dL — ABNORMAL LOW (ref 8.9–10.3)
Creatinine, Ser: 0.77 mg/dL (ref 0.44–1.00)
GFR calc non Af Amer: >60 mL/min (ref >60)
Glucose, Bld: 109 mg/dL — ABNORMAL HIGH (ref 65–99)
POTASSIUM: 3.7 mmol/L (ref 3.5–5.1)
SODIUM: 138 mmol/L (ref 135–145)

## 2015-04-23 LAB — PHOSPHORUS: Phosphorus: 2.2 mg/dL — ABNORMAL LOW (ref 2.5–4.6)

## 2015-04-23 LAB — MAGNESIUM: Magnesium: 1.7 mg/dL (ref 1.7–2.4)

## 2015-04-23 MED ORDER — FLUCONAZOLE 150 MG PO TABS: 150.0000 mg | ORAL_TABLET | Freq: Once | ORAL | Status: AC

## 2015-04-23 MED ORDER — CEPHALEXIN 500 MG PO CAPS: 500.0000 mg | ORAL_CAPSULE | Freq: Two times a day (BID) | ORAL | Status: AC

## 2015-04-23 MED ORDER — CEPHALEXIN 250 MG PO CAPS: 250.0000 mg | ORAL_CAPSULE | Freq: Three times a day (TID) | ORAL | Status: DC

## 2015-04-23 MED ORDER — MAGNESIUM SULFATE 2 GM/50ML IV SOLN
2.0000 g | Freq: Once | INTRAVENOUS | Status: AC
  Administered 2015-04-23: 2 g via INTRAVENOUS
  Filled 2015-04-23: qty 50

## 2015-04-23 MED ORDER — FLUCONAZOLE 150 MG PO TABS: 150.0000 mg | ORAL_TABLET | Freq: Once | ORAL | Status: DC

## 2015-04-23 MED ORDER — CALCIUM CARBONATE ANTACID 500 MG PO CHEW: 1.0000 | CHEWABLE_TABLET | Freq: Three times a day (TID) | ORAL | Status: AC

## 2015-04-23 MED ORDER — K PHOS MONO-SOD PHOS DI & MONO 155-852-130 MG PO TABS
500.0000 mg | ORAL_TABLET | Freq: Three times a day (TID) | ORAL | Status: DC
  Filled 2015-04-23 (×9): qty 2

## 2015-04-23 MED ORDER — THERA VITAL M PO TABS: 1.0000 | ORAL_TABLET | Freq: Every day | ORAL | Status: AC

## 2015-04-23 MED ORDER — ONDANSETRON 4 MG PO TBDP: 4.0000 mg | ORAL_TABLET | Freq: Three times a day (TID) | ORAL | Status: AC | PRN

## 2015-04-23 MED ORDER — PANTOPRAZOLE SODIUM 40 MG PO TBEC: 40.0000 mg | DELAYED_RELEASE_TABLET | Freq: Every day | ORAL | Status: AC

## 2015-04-23 MED ORDER — POTASSIUM PHOSPHATES 15 MMOLE/5ML IV SOLN
20.0000 mmol | Freq: Once | INTRAVENOUS | Status: AC
  Administered 2015-04-23: 20 mmol via INTRAVENOUS
  Filled 2015-04-23: qty 6.67

## 2015-04-23 MED ORDER — POTASSIUM PHOSPHATE MONOBASIC 500 MG PO TABS: 500.0000 mg | ORAL_TABLET | Freq: Three times a day (TID) | ORAL | Status: DC

## 2015-04-23 NOTE — Progress Notes (Signed)
PROGRESS NOTE  Melissa Green [id number]:[id number] DOB: 05/14/1925 DOA: 04/21/2015 PCP: PROVIDER NOT IN SYSTEM  Summary: 81 yo female with unclear past medical history who recently moved from [place] was brought to the ED for poor oral intake. She was found to have orthostatic hypotension, dehydration, hypokalemia and a possible UTI. While in the ED, labs revealed hypokalemia with potassium at 2.8, elevated t. bili at 1.9, elevated BUN/creatinine at 33/1.12, leukocytosis with WBC at 13.3 and positive troponin at 0.04. Patient was admitted for further management.  Assessment/Plan: 1. Dehydration, nausea, FTT, likely worsened with UTI. Resolved with IVFs. 2. Orthostatic hypotension secondary to dehydration. 3. Hypokalemia, hypomagnesemia, hypophosphatemia. Secondary to poor oral intake.  Repleted. 4. UTI. Continue empiric abx. Follow-up culture, Afebrile.  5. Vague abdominal pain (resolved). Continue to follow clinically. Chronic nausea. 6. Elevated bilirubin, likely dehydration. 7. Elevated troponin, minimal, asymptomatic, EKG non acute. Clinically insignificant. No history to suggest ACS. 8. Suspected dementia.   Overall improved.   Discussed with niece [name] and nephew [name], reviewed workup and recommendations. They plan to take her back home and are open to PT  Start empiric PPI from chronic nausea. Suggest establishing with PCP and consider outpatient GI evaluation if PPI without effect.  MVI and calcium supplementation.  Code Status: full code DVT prophylaxis: SCDs Family discussion:   Disposition Plan/Anticipated LOS: Discharge home today.   Brendia Sacks, MD  [facility]  Pager [phone] If 7PM-7AM, please contact night-coverage at [email or web address], password [email or web address] 04/23/2015, 7:00 AM  LOS: 2 days   Consultants:  PT  Procedures:    Antibiotics:  Keflex 10/13>> 10/18   HPI/Subjective: Feels fair but does not have an appetite. Denies any pain, SOB, or CP. Has been  spitting up.  Objective: Filed Vitals:   04/22/15 1129 04/22/15 1524 04/22/15 2300 04/23/15 0611  BP:  166/97 159/85 186/76  Pulse:  83 86 70  Temp:  98 F (36.7 C) 98.3 F (36.8 C) 98.1 F (36.7 C)  TempSrc:  Oral Oral Oral  Resp:  Height:      Weight:      SpO2: 100% 97% 99% 100%    Intake/Output Summary (Last 24 hours) at 04/23/15 0700 Last data filed at 04/22/15 1748  Gross per 24 hour  Intake    425 ml  Output      0 ml  Net    425 ml     Filed Weights   04/21/15 1102 04/21/15 1553  Weight: 57.153 kg (126 lb) 57.153 kg (126 lb)    Exam: VSS, afebrile, no hypoxia General:  Appears calm and comfortable Cardiovascular: RRR, no m/r/g. No LE edema. Telemetry: ST, no arrhythmias  Respiratory: CTA bilaterally, no w/r/r. Normal respiratory effort. Abdomen: soft, ntnd Psychiatric: grossly normal mood and affect, speech fluent and appropriate Neurologic: grossly non-focal.  New data reviewed:   BMP improved. Potassium  3.7, Phosphorus 2.2, Mag 1.7  CXR 10/15 independently reviewed, no acute disease.  Pertinent data since admission:  WBC 13.3  Potassium 2.8, creatinine 1.12, BUN 33  CXD -No active cardiopulmonary disease.  UA positive   Pending data:  CMP  UC  Scheduled Meds: . cephALEXin  250 mg Oral 3 times per day  . Influenza vac split quadrivalent PF  0.5 mL Intramuscular Tomorrow-1000  . levothyroxine  75 mcg Oral QAC breakfast  . pneumococcal 23 valent vaccine  0.5 mL Intramuscular Tomorrow-1000   Continuous Infusions: . sodium chloride 150 mL/hr at 04/23/15 0054  Principal Problem:   Dehydration Active Problems:   Nausea without vomiting   FTT (failure to thrive) in adult   Orthostatic hypotension   Hypophosphatemia   Hypomagnesemia    By signing my name below, I, Burnett HarryJennifer Gregorio attest that this documentation has been prepared under the direction and in the presence of Brendia Sacksaniel Ayvin Lipinski, MD Electronically signed:  Burnett HarryJennifer Gregorio, Scribe. 04/23/2015 12:25pm  I personally performed the services described in this documentation. All medical record entries made by the scribe were at my direction. I have reviewed the chart and agree that the record reflects my personal performance and is accurate and complete. Brendia Sacksaniel Kareen Hitsman, MD

## 2015-04-23 NOTE — Discharge Summary (Addendum)
Physician Discharge Summary  Melissa FrancoSharon Green ZOX:[id number] DOB: 23-Sep-1924 DOA: 04/21/2015  PCP: PROVIDER NOT IN SYSTEM  Admit date: 04/21/2015 Discharge date: 04/23/2015  Recommendations for Outpatient Follow-up:  1. Have recommended establishing with PCP and close follow-up. 2. Started on empiric PPI. Consider outpatient GI evaluation if chronic nausea and poor appetite does not improve.  3. Stopped alendronate for now but could consider restarting if GI symptoms resolve. 4. Isolated elevation of total bilirubin of unclear significance. Consider checking LFTs as outpatient.   Follow-up Information    Follow up with [facility].   Contact information:   [address] [place] [address] [phone]        Discharge Diagnoses:  1. Dehydration, nausea, FTT  2. Orthostatic hypotension. 3. Hypokalemia, hypomagnesemia, hypophosphatemia. 4. UTI.   5. Vague abdominal pain   6. Elevated bilirubin. 7. Possible dementia.  Discharge Condition: Improved Disposition: Home  Diet recommendation: Heart-healthy  Filed Weights   04/21/15 1102 04/21/15 1553  Weight: 57.153 kg (126 lb) 57.153 kg (126 lb)    History of present illness:  81 yo female with unclear past medical history who recently moved from [place] was brought to the ED for poor oral intake. She was found to have orthostatic hypotension, dehydration, hypokalemia and a possible UTI. While in the ED, labs revealed hypokalemia with potassium at 2.8, elevated t. bili at 1.9, elevated BUN/creatinine at 33/1.12, leukocytosis with WBC at 13.3 and positive troponin at 0.04. Patient was admitted for further management.  Hospital Course:  Dehydration and FTT with associated hypokalemia, hypomagnesemia, hypophosphatemia and orthostatic hypotension treated with IVFs and mineral replacement. Patient reported having poor oral intake prior to admission. Encouraged to maintain a healthy well-rounded diet. UTI was  treated with empiric abx. Will continue oral abx as outpatient. UC pending. Vague abdominal pain with unclear etiology resolved spontaneously.  1. Dehydration, nausea, FTT, likely worse with UTI. Resolved with IVFs. 2. Orthostatic hypotension, resolved with resolution of dehydration. 3. Hypokalemia, hypomagnesemia, hypophosphatemia. Secondary to poor oral intake. Resolved. 4. UTI. Continue empiric abx. Follow-up culture, Afebrile. . 5. Vague abdominal pain (resolved). Continue to follow clinically. 6. Elevated bilirubin, likely dehydration. 7. Elevated troponin, minimal, asymptomatic, EKG non acute. Doubt significant. No history to suggest ACS. 8. Suspected dementia.  Consultants:  PT  Procedures:  none  Antibiotics:  Keflex 10/13>>10/18   Discharge Instructions Discharge Instructions    Diet general    Complete by:  As directed      Discharge instructions    Complete by:  As directed   Call your physician or seek immediate medical attention for weakness, not eating, confusion, dehydration or worsening of condition.     Increase activity slowly    Complete by:  As directed             Current Discharge Medication List    START taking these medications   Details  calcium carbonate (TUMS) 500 MG chewable tablet Chew 1 tablet (200 mg of elemental calcium total) by mouth 3 (three) times daily.    cephALEXin (KEFLEX) 250 MG capsule Take 1 capsule (250 mg total) by mouth every 8 (eight) hours. Qty: 18 capsule, Refills: 0    fluconazole (DIFLUCAN) 150 MG tablet Take 1 tablet (150 mg total) by mouth once. Qty: 1 tablet, Refills: 0    Multiple Vitamins-Minerals (MULTIVITAMIN) tablet Take 1 tablet by mouth daily.    ondansetron (ZOFRAN ODT) 4 MG disintegrating tablet Take 1 tablet (4 mg total) by mouth every  8 (eight) hours as needed for nausea or vomiting. Qty: 20 tablet, Refills: 0    pantoprazole (PROTONIX) 40 MG tablet Take 1 tablet (40 mg total) by mouth  daily. Qty: 30 tablet, Refills: 0      CONTINUE these medications which have NOT CHANGED   Details  aspirin EC 81 MG tablet Take 81 mg by mouth daily.    atorvastatin (LIPITOR) 40 MG tablet Take 40 mg by mouth daily.    levothyroxine (SYNTHROID, LEVOTHROID) 75 MCG tablet Take 75 mcg by mouth daily before breakfast.      STOP taking these medications     alendronate (FOSAMAX) 70 MG tablet        No Known Allergies  The results of significant diagnostics from this hospitalization (including imaging, microbiology, ancillary and laboratory) are listed below for reference.    Significant Diagnostic Studies: Dg Chest 2 View  04/23/2015  CLINICAL DATA:  Cough, dehydration EXAM: CHEST  2 VIEW COMPARISON:  04/21/2015 FINDINGS: The heart size and mediastinal contours are within normal limits. Both lungs are clear. The visualized skeletal structures are unremarkable. IMPRESSION: No active cardiopulmonary disease. Electronically Signed   By: Natasha Mead M.D.   On: 04/23/2015 14:24   Dg Chest 2 View  04/21/2015  CLINICAL DATA:  Cough and congestion for 2 days EXAM: CHEST  2 VIEW COMPARISON:  None. FINDINGS: Normal heart size. Aortic tortuosity, mild for age. There is no edema, consolidation, effusion, or pneumothorax. IMPRESSION: No active cardiopulmonary disease. Electronically Signed   By: Marnee Spring M.D.   On: 04/21/2015 11:57       Labs: Basic Metabolic Panel:  Recent Labs Lab 04/21/15 1222 04/22/15 0720 04/23/15 0638  NA 143 144 138  K 2.8* 2.4* 3.7  CL 108 108 107  CO2 20* 23 23  GLUCOSE 116* 102* 109*  BUN 33* 27* 15  CREATININE 1.12* 1.00 0.77  CALCIUM 8.8* 8.4* 7.5*  MG  --  1.6* 1.7  PHOS  --  1.5* 2.2*   Liver Function Tests:  Recent Labs Lab 04/21/15 1222 04/22/15 0720  AST 23 25  ALT 13* 13*  ALKPHOS 67 65  BILITOT 1.9* 1.9*  PROT 6.6 6.6  ALBUMIN 3.7 3.9    Recent Labs Lab 04/21/15 1234  LIPASE 36   CBC:  Recent Labs Lab 04/21/15 1222  04/22/15 0652  WBC 13.3* 12.5*  NEUTROABS 11.7*  --   HGB 13.1 13.1  HCT 38.1 39.0  MCV 94.1 95.8  PLT 234 216   Cardiac Enzymes:  Recent Labs Lab 04/21/15 1222  TROPONINI 0.04*      Principal Problem:   Dehydration Active Problems:   Nausea without vomiting   FTT (failure to thrive) in adult   Orthostatic hypotension   Hypophosphatemia   Hypomagnesemia   Time coordinating discharge: 35 minutes  Signed:  Brendia Sacks, MD [facility] 04/23/2015, 12:20 PM  By signing my name below, I, Burnett Harry attest that this documentation has been prepared under the direction and in the presence of Brendia Sacks, MD Electronically signed: Burnett Harry, Scribe. 04/23/2015  I personally performed the services described in this documentation. All medical record entries made by the scribe were at my direction. I have reviewed the chart and agree that the record reflects my personal performance and is accurate and complete. Brendia Sacks, MD

## 2015-04-23 NOTE — Progress Notes (Signed)
Moderate amts of thick sputum produced during coughing most of night.  Pt called for assistance to use bedside commode but did not realize when she had finished peeing. This nurse provided applesauce to aid in swallowing pills.  She rested most of night with no complaints of discomfort.

## 2015-04-27 LAB — URINE CULTURE: Culture: 90000

## 2015-05-10 DEATH — deceased

## 2016-02-08 IMAGING — DX DG CHEST 2V
2 series · 2 of 2 positions shown · non-contrast
Comparison: None.

CLINICAL DATA: Cough and congestion for 2 days

EXAM:
CHEST  2 VIEW

[chest pa]
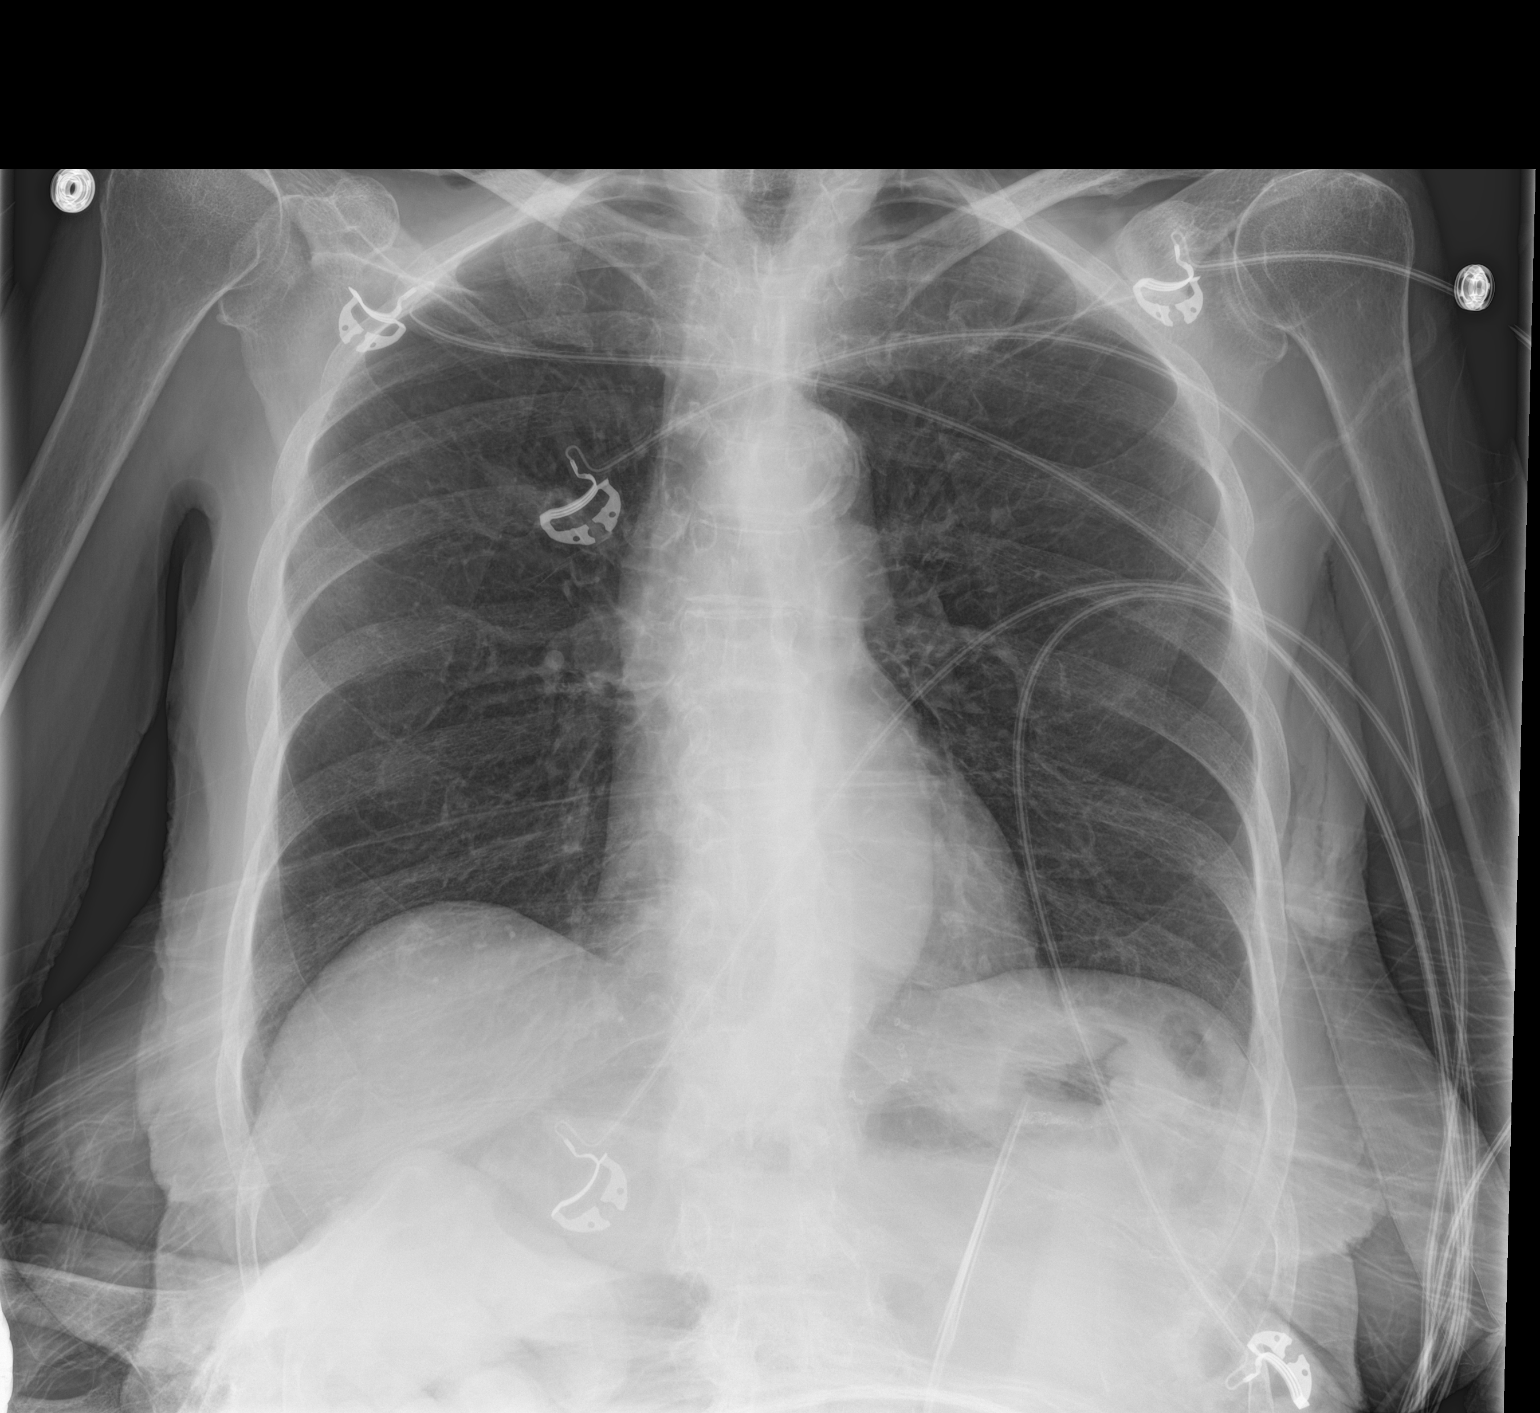

[chest lat]
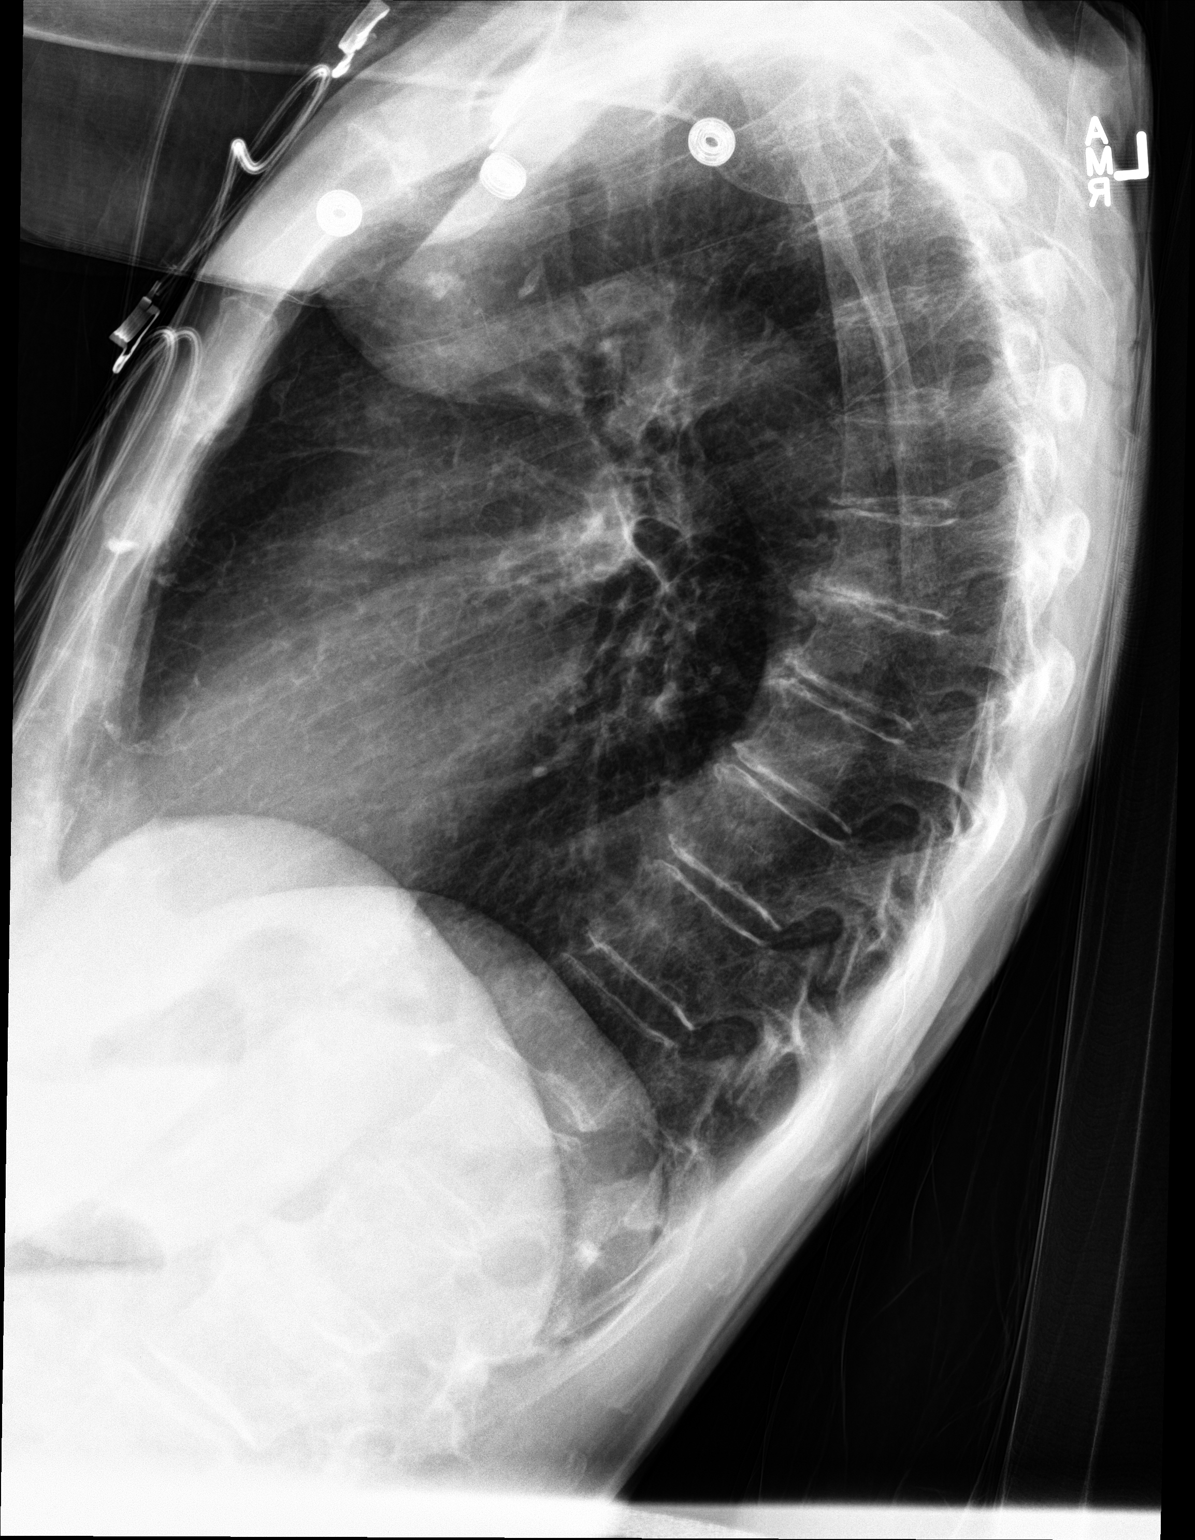

[2 of 2 positions shown; findings below may reference images not displayed]

FINDINGS: Normal heart size. Aortic tortuosity, mild for age. There is no
edema, consolidation, effusion, or pneumothorax.
IMPRESSION: No active cardiopulmonary disease.

## 2016-02-10 IMAGING — DX DG CHEST 2V
2 series · 2 of 2 positions shown · non-contrast
Comparison: 04/21/2015

CLINICAL DATA: Cough, dehydration

EXAM:
CHEST  2 VIEW

[chest lat]
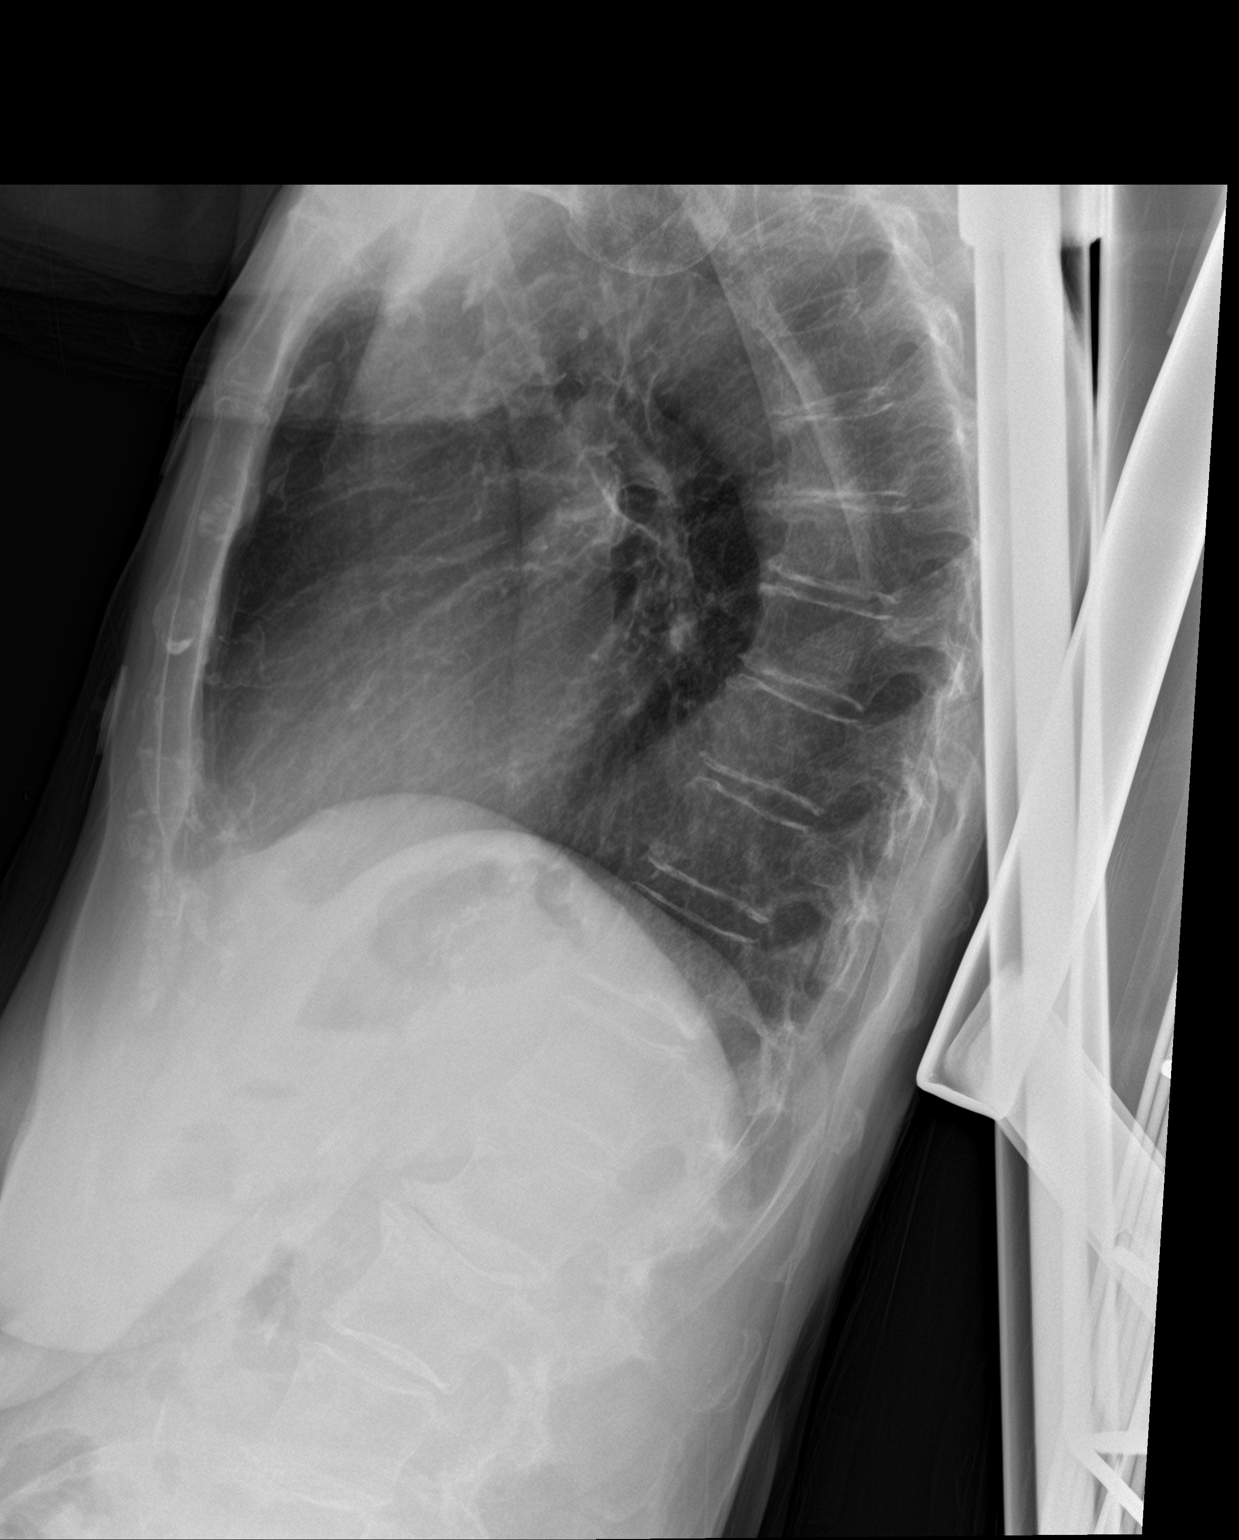

[chest ap]
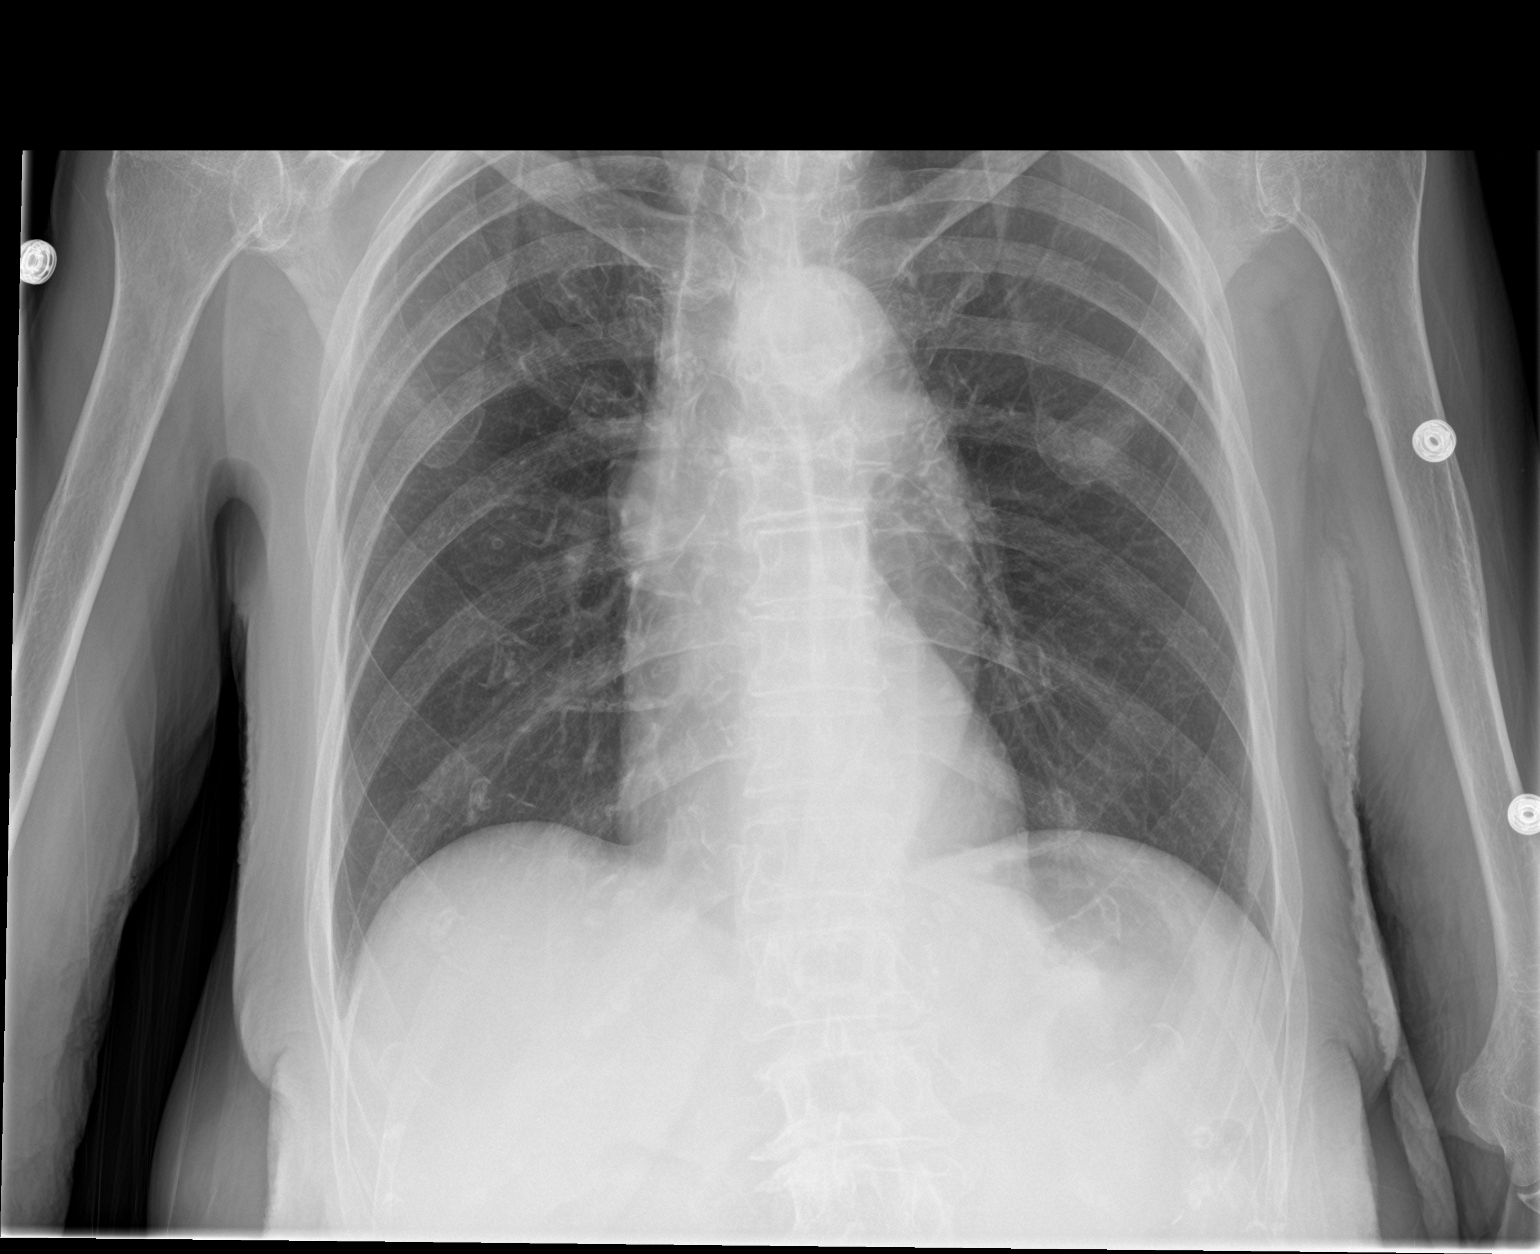

[2 of 2 positions shown; findings below may reference images not displayed]

FINDINGS: The heart size and mediastinal contours are within normal limits.
Both lungs are clear. The visualized skeletal structures are
unremarkable.
IMPRESSION: No active cardiopulmonary disease.
